# Patient Record
Sex: Female | Born: 1950 | Hispanic: No | Marital: Married
Health system: Southern US, Community
[De-identification: ages and names within clinical notes are randomized; demographics above are authoritative.]

---

## 2004-06-24 ENCOUNTER — Emergency Department: Payer: Self-pay | Admitting: Emergency Medicine

## 2005-03-30 ENCOUNTER — Ambulatory Visit: Payer: Self-pay | Admitting: Family Medicine

## 2005-04-04 ENCOUNTER — Ambulatory Visit: Payer: Self-pay | Admitting: Family Medicine

## 2005-07-12 ENCOUNTER — Other Ambulatory Visit: Payer: Self-pay

## 2005-07-12 ENCOUNTER — Ambulatory Visit: Payer: Self-pay | Admitting: Surgery

## 2005-07-17 ENCOUNTER — Ambulatory Visit: Payer: Self-pay | Admitting: Surgery

## 2006-04-19 ENCOUNTER — Ambulatory Visit: Payer: Self-pay | Admitting: Internal Medicine

## 2006-04-21 ENCOUNTER — Ambulatory Visit: Payer: Self-pay | Admitting: Internal Medicine

## 2006-12-07 ENCOUNTER — Emergency Department: Payer: Self-pay | Admitting: Emergency Medicine

## 2006-12-21 ENCOUNTER — Ambulatory Visit: Payer: Self-pay | Admitting: Internal Medicine

## 2007-01-07 ENCOUNTER — Ambulatory Visit: Payer: Self-pay | Admitting: Internal Medicine

## 2007-01-21 ENCOUNTER — Ambulatory Visit: Payer: Self-pay | Admitting: Internal Medicine

## 2007-02-20 ENCOUNTER — Ambulatory Visit: Payer: Self-pay | Admitting: Internal Medicine

## 2007-02-28 ENCOUNTER — Ambulatory Visit: Payer: Self-pay

## 2007-12-06 ENCOUNTER — Other Ambulatory Visit: Payer: Self-pay

## 2007-12-06 ENCOUNTER — Emergency Department: Payer: Self-pay | Admitting: Internal Medicine

## 2009-08-20 ENCOUNTER — Ambulatory Visit: Payer: Self-pay | Admitting: Internal Medicine

## 2009-09-09 ENCOUNTER — Ambulatory Visit: Payer: Self-pay | Admitting: Internal Medicine

## 2009-09-15 ENCOUNTER — Ambulatory Visit: Payer: Self-pay | Admitting: Family Medicine

## 2009-09-19 ENCOUNTER — Ambulatory Visit: Payer: Self-pay | Admitting: Internal Medicine

## 2010-08-27 ENCOUNTER — Emergency Department: Payer: Self-pay | Admitting: Emergency Medicine

## 2010-12-26 ENCOUNTER — Ambulatory Visit: Payer: Self-pay | Admitting: Internal Medicine

## 2011-03-21 ENCOUNTER — Inpatient Hospital Stay: Payer: Self-pay | Admitting: Internal Medicine

## 2011-04-20 ENCOUNTER — Ambulatory Visit: Payer: Self-pay | Admitting: Vascular Surgery

## 2011-05-24 ENCOUNTER — Ambulatory Visit: Payer: Self-pay | Admitting: Vascular Surgery

## 2011-06-21 ENCOUNTER — Ambulatory Visit: Payer: Self-pay | Admitting: Vascular Surgery

## 2011-09-26 ENCOUNTER — Ambulatory Visit: Payer: Self-pay | Admitting: Vascular Surgery

## 2011-09-26 LAB — POTASSIUM: Potassium: 3.8 mmol/L (ref 3.5–5.1)

## 2011-11-15 ENCOUNTER — Ambulatory Visit: Payer: Self-pay | Admitting: Vascular Surgery

## 2012-01-19 ENCOUNTER — Emergency Department: Payer: Self-pay | Admitting: *Deleted

## 2012-01-28 ENCOUNTER — Inpatient Hospital Stay: Payer: Self-pay | Admitting: Internal Medicine

## 2012-01-28 LAB — COMPREHENSIVE METABOLIC PANEL
Alkaline Phosphatase: 98 U/L (ref 50–136)
Chloride: 103 mmol/L (ref 98–107)
Co2: 22 mmol/L (ref 21–32)
EGFR (African American): 6 — ABNORMAL LOW
EGFR (Non-African Amer.): 5 — ABNORMAL LOW
SGOT(AST): 36 U/L (ref 15–37)
SGPT (ALT): 17 U/L (ref 12–78)

## 2012-01-28 LAB — DRUG SCREEN, URINE
Amphetamines, Ur Screen: NEGATIVE (ref ?–1000)
Cocaine Metabolite,Ur ~~LOC~~: NEGATIVE (ref ?–300)
Opiate, Ur Screen: POSITIVE (ref ?–300)
Phencyclidine (PCP) Ur S: NEGATIVE (ref ?–25)
Tricyclic, Ur Screen: NEGATIVE (ref ?–1000)

## 2012-01-28 LAB — CBC WITH DIFFERENTIAL/PLATELET
Basophil %: 0.4 %
Eosinophil #: 0 10*3/uL (ref 0.0–0.7)
Eosinophil %: 0.1 %
HCT: 19.8 % — ABNORMAL LOW (ref 35.0–47.0)
HGB: 6.4 g/dL — ABNORMAL LOW (ref 12.0–16.0)
Lymphocyte #: 1.5 10*3/uL (ref 1.0–3.6)
Lymphocyte %: 29 %
MCH: 26.4 pg (ref 26.0–34.0)
MCHC: 32.4 g/dL (ref 32.0–36.0)
MCV: 82 fL (ref 80–100)
Neutrophil #: 3.2 10*3/uL (ref 1.4–6.5)
Neutrophil %: 62.5 %
RBC: 2.43 10*6/uL — ABNORMAL LOW (ref 3.80–5.20)
RDW: 14.9 % — ABNORMAL HIGH (ref 11.5–14.5)

## 2012-01-28 LAB — URINALYSIS, COMPLETE
Bilirubin,UR: NEGATIVE
Ketone: NEGATIVE
Ph: 5 (ref 4.5–8.0)
Protein: NEGATIVE
RBC,UR: 15 /HPF (ref 0–5)
Specific Gravity: 1.01 (ref 1.003–1.030)
Squamous Epithelial: <1

## 2012-01-29 LAB — CBC WITH DIFFERENTIAL/PLATELET
Basophil #: 0 10*3/uL (ref 0.0–0.1)
Eosinophil #: 0 10*3/uL (ref 0.0–0.7)
Eosinophil %: 0.1 %
Lymphocyte #: 1.1 10*3/uL (ref 1.0–3.6)
MCH: 26.1 pg (ref 26.0–34.0)
MCV: 82 fL (ref 80–100)
Monocyte #: 0.4 x10 3/mm (ref 0.2–0.9)
Monocyte %: 8.6 %
Neutrophil #: 3 10*3/uL (ref 1.4–6.5)
Platelet: 92 10*3/uL — ABNORMAL LOW (ref 150–440)
RDW: 14.4 % (ref 11.5–14.5)

## 2012-01-29 LAB — BASIC METABOLIC PANEL
Calcium, Total: 5.5 mg/dL — CL (ref 8.5–10.1)
Chloride: 104 mmol/L (ref 98–107)
Co2: 18 mmol/L — ABNORMAL LOW (ref 21–32)
Creatinine: 7.8 mg/dL — ABNORMAL HIGH (ref 0.60–1.30)
Sodium: 134 mmol/L — ABNORMAL LOW (ref 136–145)

## 2012-02-03 LAB — CULTURE, BLOOD (SINGLE)

## 2012-05-22 DEATH — deceased

## 2014-01-29 IMAGING — US US EXTREM LOW VENOUS BILAT
1 series · 14 of 24 positions shown · non-contrast
Comparison: none

REASON FOR EXAM: tender and swollen
COMMENTS:

PROCEDURE:     US  - US DOPPLER LOW EXTR BILATERAL  - January 29, 2012 [DATE]
RESULT:     Comparison: None

[Series 1: us extrem low venous bilat · 0.09mm/px · 14 of 70 slices shown]
[im 1/70]
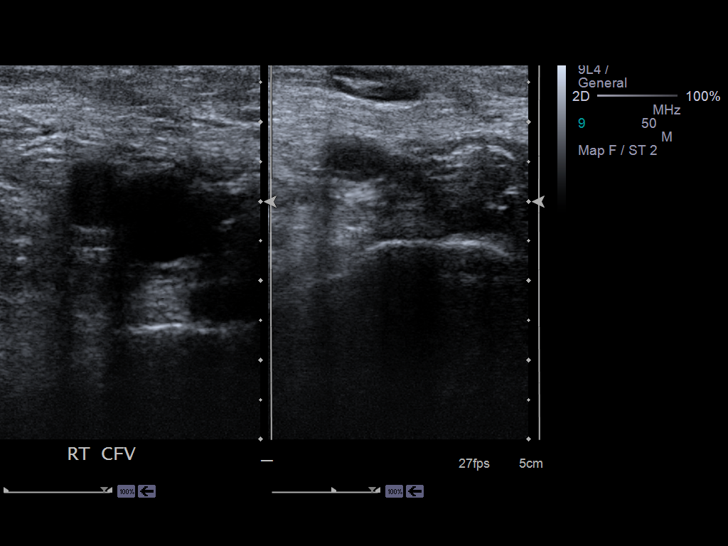
[im 7/70]
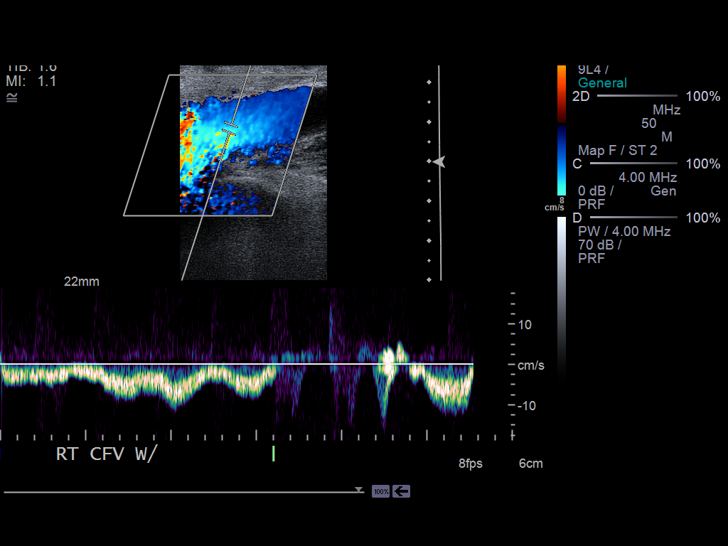
[im 13/70]
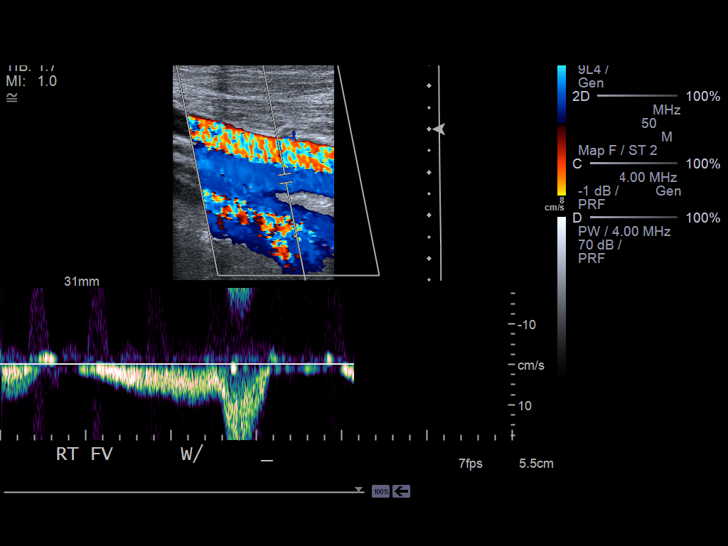
[im 19/70]
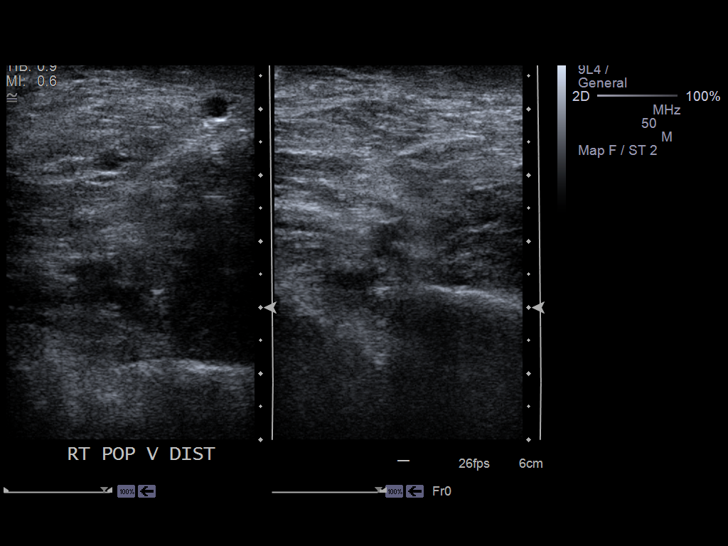
[im 22/70]
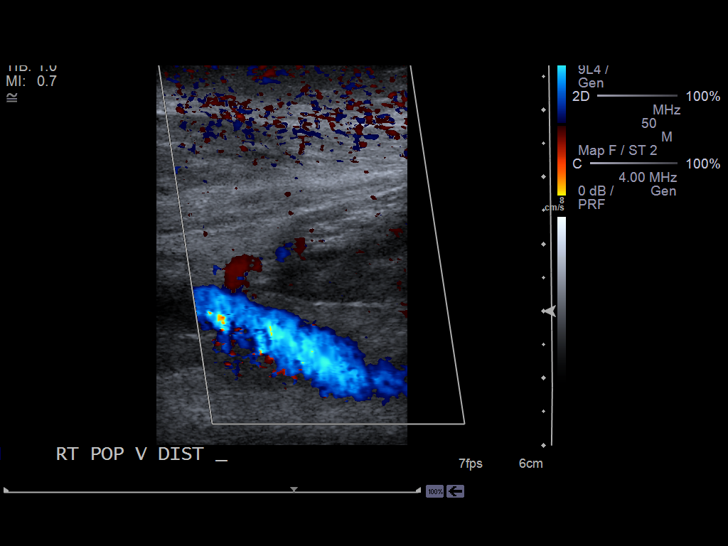
[im 28/70]
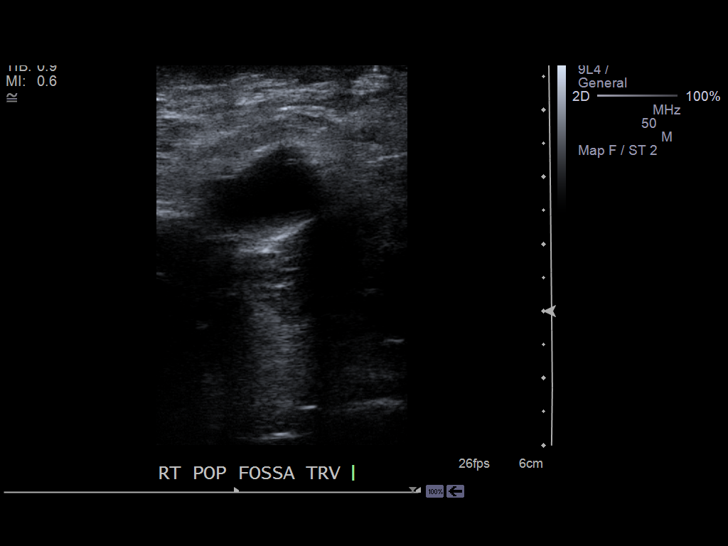
[im 34/70]
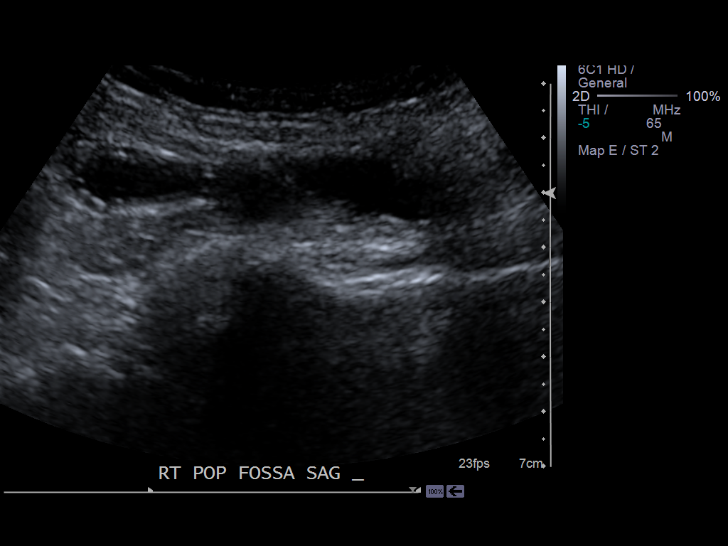
[im 37/70]
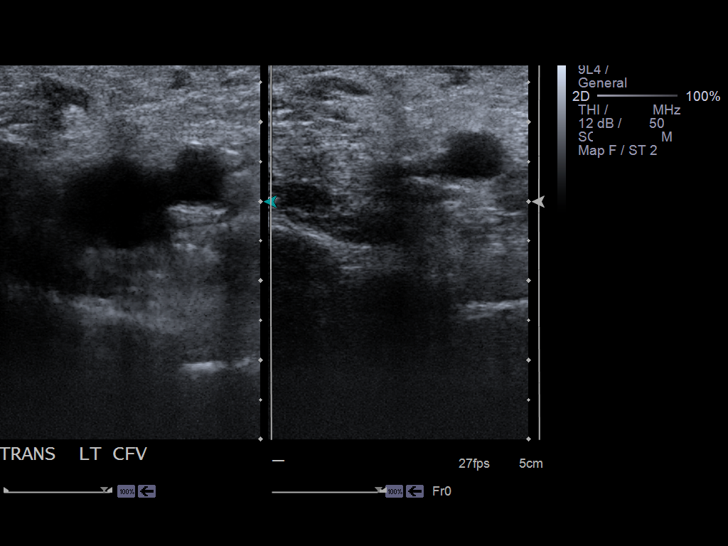
[im 43/70]
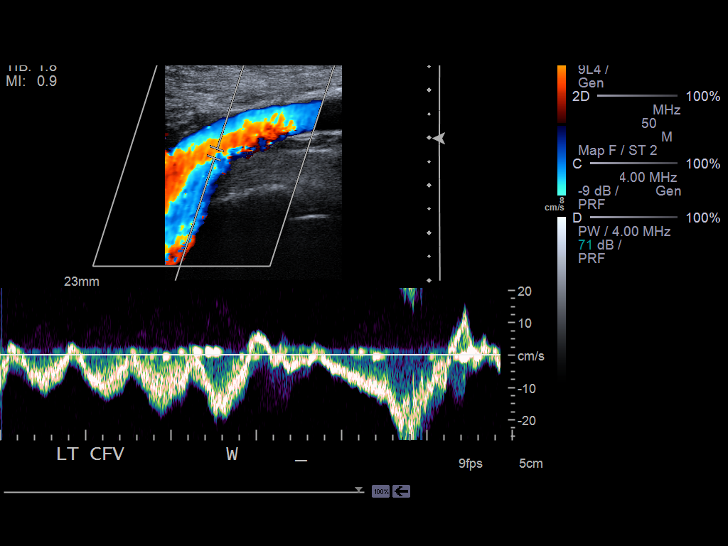
[im 49/70]
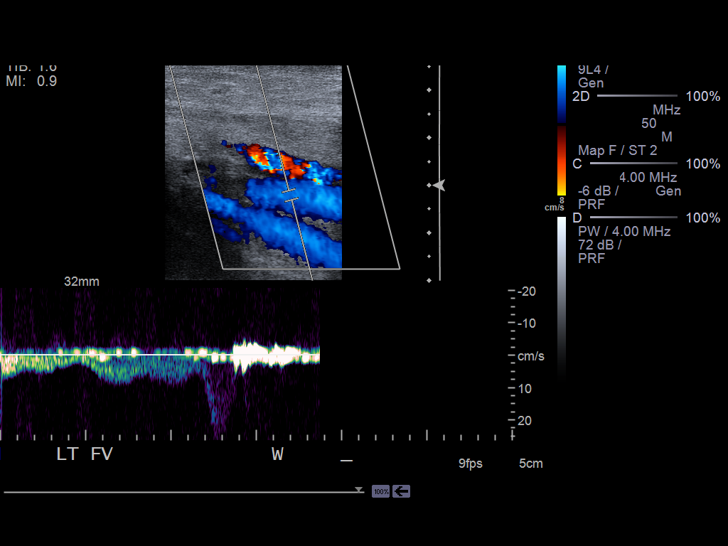
[im 55/70]
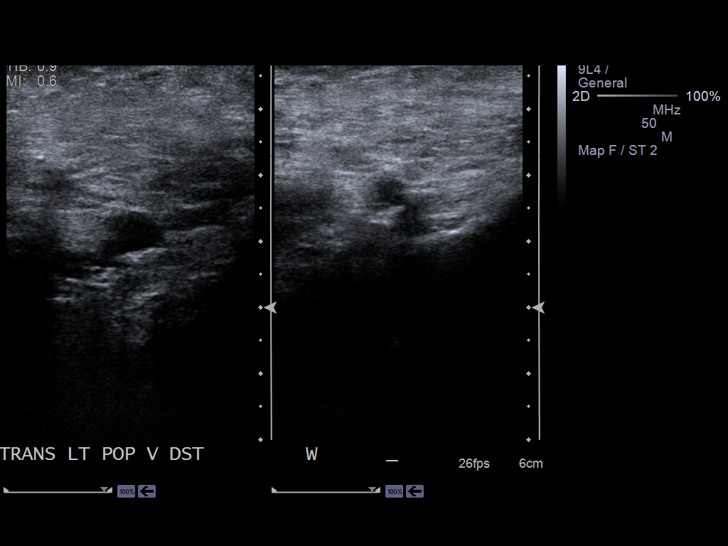
[im 58/70]
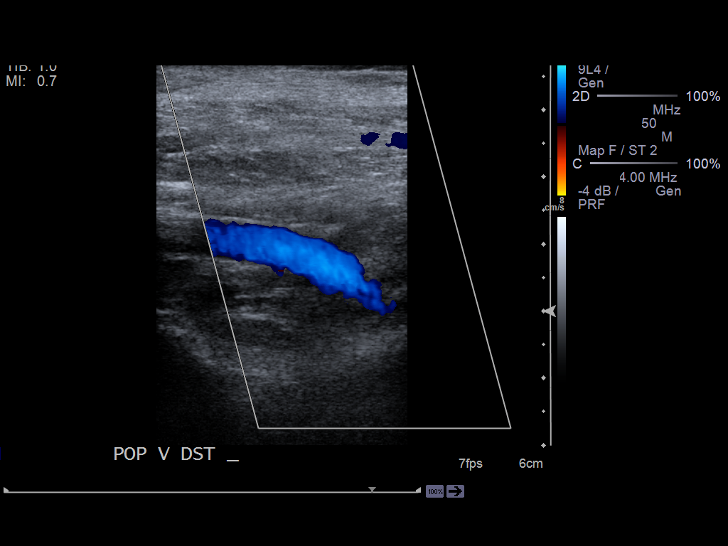
[im 64/70]
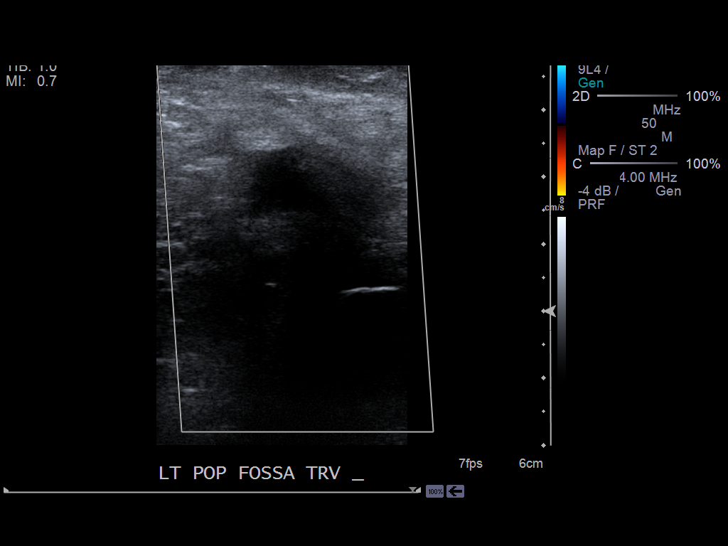
[im 70/70]
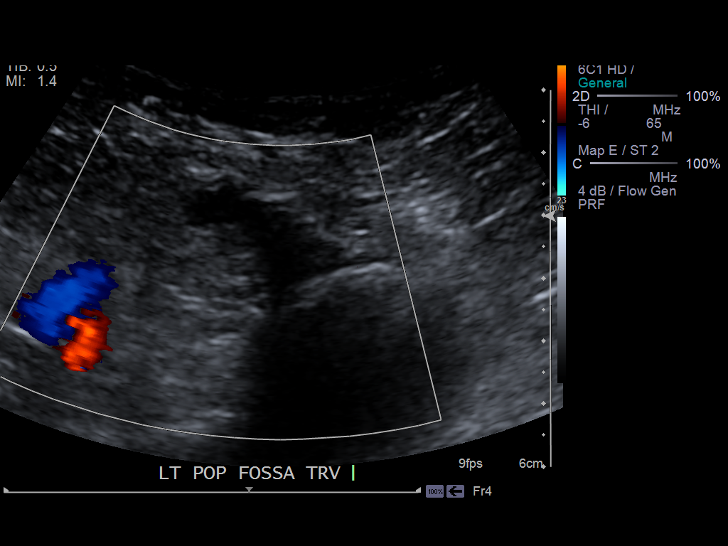

[14 of 24 positions shown; findings below may reference images not displayed]

FINDINGS: Multiple longitudinal and transverse gray-scale as well as color
and spectral Doppler images of the bilateral lower extremity veins were
obtained from the common femoral veins through the popliteal veins.

The bilateral common femoral, femoral, and popliteal veins are patent,
demonstrating normal color-flow and compressibility. No intraluminal
thrombus is identified.There is normal respiratory variation and
augmentation demonstrated at all vein levels.

There are hypoechoic collections in the popliteal fossa bilaterally. These
likely represent Baker's cysts. On the right, it measures 6.3 x 2.0 x
cm. On the left it measures 3.8 x 1.5 x 2.2 cm.
IMPRESSION: No evidence of DVT in the bilateral lower extremities.

[REDACTED]

## 2014-09-08 NOTE — H&P (Signed)
PATIENT NAME:  Valerie Tyler, Valerie Tyler MR#:  [id number] DATE OF BIRTH:  [birth year]  DATE OF ADMISSION:  01/28/2012  ADMITTING PHYSICIAN: Valerie Baas, MD    PRIMARY CARE PHYSICIAN: Valerie Hancock, MD at  [facility]   CHIEF COMPLAINT: Brought in by EMS for generalized weakness and aches all over.   HISTORY OF PRESENT ILLNESS: [name] is a 64 year old African American female with past medical history significant for schizophrenia and bipolar disorder, endstage renal disease on hemodialysis, hypertension, and chronic pain syndrome, was brought from home by EMS as she mentioned aching all over and feeling weak. The patient is a very inconsistent and poor historian. I tried to reach her daughters but was not able to reach them and left a voicemail. According to one of her versions, she had been aching all over so called EMS this morning. Her daughter called around 3:00 a.m. last night. She works in [employer] and is being deployed, and they talked about the conditions in [place]. Then she told her that she ached all over, including her follicles. Her story changed a little bit later.  She does not even remember she was in the hospital. She is on a lot of psychiatric medications. I am not sure if this is her baseline or this is secondary to medications. It seems like the same thing happened when she was here in the hospital in October 2012 with her mental status. Based on her labs, her potassium seems to be within normal limits, but her calcium is very low at 5.1 with a normal albumin; and her creatinine is elevated at greater than 8.0. She also has a low hemoglobin of 6.8. When asked about her if she runs low, she said she was at [facility] last week about her low hemoglobin, and she does not remember what they were telling her. She is being admitted for further evaluation and treatment.   PAST MEDICAL HISTORY: 1. End-stage renal disease on hemodialysis.  2. Anemia of chronic disease. 3. Hypertension.   4. Chronic pain syndrome.  5. Schizophrenia and bipolar disorder.  6. Gastroesophageal reflux disease.   PAST SURGICAL HISTORY: Fistula  placement in the right forearm. Other surgery she is not able to tell at this time.   ALLERGIES: Based on the records, the patient is allergic to IV dye, morphine, and also latex.   CURRENT MEDICATIONS: There is no complete list of her medications at this time. Based on her last discharge and from pharmacist note, the patient is on the following medications.  1. Cyclobenzaprine 10 mg every 8 hours p.r.n.  2. Klonopin 2 mg t.i.d.  3. Seroquel 100 mg at bedtime.  4. Diazepam 10 mg on dialysis days and one at night as needed for anxiety.  5. Effexor 150 mg p.o. daily.  6. Triamcinolone ointment as needed, apply to the area.  7. Hydroxyzine 25 mg every 6 hours p.r.n.  8. Travatan Solution Eye Drops once a day, 1 drop to both eyes.  9. Flonase nasal spray, 2 sprays each nostril every day.  10. Zyrtec 10 mg daily.  11. Norvasc 5 mg p.o. daily.  12. 5% Lidoderm patch as needed for pain.   SOCIAL HISTORY: She lives at home with her daughter. No smoking or alcohol use.   FAMILY HISTORY: From previous records, it seems like her father had oral cancer.   REVIEW OF SYSTEMS: Review of systems is difficult to be obtained secondary to the patient's mental status. CONSTITUTIONAL: She complains of fatigue  and weakness. There is no fever. EYES: She says she has glaucoma. No blurred vision or double vision. ENT: No tinnitus, ear pain, or hearing loss. RESPIRATORY: No cough, wheeze, or chronic obstructive pulmonary disease. CARDIOVASCULAR: No chest pain, orthopnea, edema, arrhythmia, or palpitations. GASTROINTESTINAL: No nausea, vomiting, abdominal pain, hematemesis, or melena. GENITOURINARY: The patient makes only a little urine, and she is on dialysis. ENDOCRINE: No polyuria, nocturia, thyroid problems, heat or cold intolerance. HEMATOLOGY: Positive for anemia. SKIN:  There is like a macular mottled rash on both her thighs and also both her legs. MUSCULOSKELETAL: Her legs beneath the knees appear swollen and also erythematous. NEUROLOGIC: No numbness, weakness, cerebrovascular accident, transient ischemic attack. PSYCHOLOGIC: Positive for history of schizophrenia, and bipolar disorder, and also anxiety.    PHYSICAL EXAMINATION:  VITAL SIGNS: Temperature is 97.4 degrees Fahrenheit, pulse 89, respirations 18, blood pressure 108/64, pulse oximetry 97% on room air.   GENERAL: A well built, well nourished female lying in bed, not in any acute distress.   HEENT: Normocephalic, atraumatic. Pupils are equal, round, reacting to light. Anicteric sclerae. Extraocular movements are intact. Oropharynx is clear without erythema, mass or exudates.   NECK: Supple. She has engorged vein in the right supraclavicular fossa likely from increased pressure from her fistula. No lymphadenopathy or thyromegaly.   LUNGS: Clear to auscultation bilaterally. No wheeze or crackles. No use of accessory muscles for breathing.   CARDIOVASCULAR: S1, S2, regular rate and rhythm. No murmurs, rubs, or gallops.   ABDOMEN: Soft, nontender, nondistended. No hepatosplenomegaly. Normal bowel sounds.   EXTREMITIES: She has 2+ pitting edema beneath the knees, erythema. They are warm to touch and also slightly tender. She has feeble dorsalis pedis pulses palpable bilaterally.   SKIN: She has mottled macular erythematous rash on both lower extremities, including the thighs.   LYMPHATICS: No cervical or inguinal lymphadenopathy.   NEUROLOGIC: Cranial nerves are intact. She is able to move all four extremities. She did not let me do a complete neurological exam otherwise.   PSYCHOLOGICAL: The patient is sleepy, easily arousable, and oriented x2 to place and person.   LABORATORY, DIAGNOSTIC AND RADIOLOGICAL DATA: WBC is 5.9, hemoglobin 6.1, hematocrit 19.8, platelet count 104, MCV is 82.0. Sodium  137, potassium 4.2, chloride 103, bicarbonate 22, BUN 8, creatinine 8.29, glucose 89, calcium 5.1. Albumin 3.7, total bilirubin 0.3, AST 36, ALT 17. Urine drug screen is negative. Urinalysis showing 2+ blood and 15 RBC. trace  bacteria and no protein. EKG: Normal sinus rhythm 83, slightly prolonged QT interval at 438 ms. Corrected QT is 514 ms.   ASSESSMENT AND PLAN: A 64 year old female with end-stage renal disease on hemodialysis, schizophrenia, bipolar. She was brought by EMS from home and is complaining of body aches. Her labs are showing low calcium of 5.1, creatinine elevated, and also low hemoglobin. She is a very poor historian and unable to reach the family.   1. Hypocalcemia with some EKG changes: We will do calcium supplements IV and also Nephrology has been consulted, and she will be monitored on telemetry.  2. Bilateral lower extremity cellulitis: Warm, tender and erythematous, unable to know when it started. Dopplers are ordered to rule out deep venous thrombosis. Blood cultures are ordered and vancomycin and Levaquin are started. There is also a rash on the thighs which could be allergic reaction versus part of cellulitis. The patient is unable to tell if any new medications were started. As part of the conversation, she did mention she had some dental  work done last week, and she is not sure if any new antibiotics were started. I need to verify all of this with the family.  3. End-stage renal disease, on hemodialysis: Nephrology has been consulted.  4. Acute on chronic anemia with weakness and fatigue: We will transfuse 1 unit with dialysis and likely Epogen then after.  5. Schizophrenia: Continue home medications of Seroquel, Klonopin and Effexor.  6. Gastrointestinal and deep venous thrombosis prophylaxis: On Protonix and also subcutaneous heparin.   CODE STATUS: FULL CODE.   TIME SPENT ON ADMISSION: 50 minutes. ____________________________ Valerie Baasadhika Zahriyah Joo,  MD rk:cbb D: 01/28/2012 12:55:32 ET T: 01/28/2012 14:10:01 ET JOB#: [id number]  cc: Valerie Baasadhika Xaivier Malay, MD, <Dictator> Sarah "Sallie" Allena KatzPatel, MD [facility] Valerie BaasADHIKA Shoshanna Mcquitty MD ELECTRONICALLY SIGNED 01/28/2012 18:45

## 2014-09-08 NOTE — Discharge Summary (Signed)
PATIENT NAME:  Valerie Tyler, Valerie Tyler MR#:  [id number] DATE OF BIRTH:  Jun 30, 1950  DATE OF ADMISSION:  01/28/2012 DATE OF DISCHARGE:  01/30/2012  The patient left AGAINST MEDICAL ADVICE on 01/30/2012.   PRESENTING COMPLAINT: Weakness and fatigue.   DISCHARGE DIAGNOSES:  1. Hypocalcemia.  2. Bilateral lower extremity edema.  3. End-stage renal disease on hemodialysis.  4. Medical noncompliance.  5. Acute on chronic anemia status post 1 unit of blood transfusion.  6. History of schizophrenia.   DISCHARGE MEDICATIONS: Not dictated since the patient left AGAINST MEDICAL ADVICE.   ADMISSION HOME MEDICATIONS: 1. Clonazepam 2 mg one tablet three times daily. 2. Cyclobenzaprine 10 mg three times daily. 3. Diazepam 10 mg on dialysis days and one at bedtime as needed.  4. Effexor-XR extended-release two capsules daily.  5. Flonase 50 mcg/inhalation one spray daily.  6. Hydroxyzine hydrochloride 25 mg one tablet four times daily. 7. Lidoderm topically as needed.  8. Norvasc 5 mg once a day.  9. Oxycodone 10 mg one tablet orally every six hours.  10. Seroquel 100 mg orally once a day at bedtime.  11. Travatan Z 0.004% ophthalmic one drop in affected eye once a day in the evening.  12. Triamcinolone topically once a day.  13. Zyrtec 10 mg p.o. daily.   CONSULTANTS: Mosetta PigeonHarmeet Singh, MD  BRIEF SUMMARY OF HOSPITAL COURSE:  1. [name] is a 64 year old African American female with history of end stage renal disease on hemodialysis, schizophrenia, and bipolar disorder brought by EMS from home complaining of aching all over. The patient does not remember when she was dialyzed last. She was admitted with hypocalcemia with EKG changes. Calcium supplement was given IV and oral was started. This is likely related to her CKD stage V and her being noncompliant with her dialysis. She continued to improve with her weakness.  2. Bilateral lower extremity edema. Initially it was thought there is some underlying  cellulitis, however, the patient remained afebrile and white count was normal and antibiotics were discontinued. Doppler of the lower extremity was negative for deep venous thrombosis.  3. End stage renal disease, on hemodialysis. The patient does not remember when she got dialyzed. She is noncompliant with dialysis. The patient has been fired by several dialysis units and it is difficult to get her placed at this time. Care management and Dr. Thedore MinsSingh were working with different dialysis units in the area, however, they could not find any new dialysis placement prior to the patient leaving AGAINST MEDICAL ADVICE.  4. Acute on chronic anemia with weakness. Transfused 1 unit on admission.  5. Schizophrenia/bipolar disorder. She is on Seroquel, Klonopin, and Effexor.   Her hospital stay otherwise remained stable.   TIME SPENT: 40 minutes.  ____________________________ Wylie HailSona A. Allena KatzPatel, MD sap:slb D: 01/31/2012 07:09:54 ET T: 01/31/2012 11:47:44 ET JOB#: [id number]  cc: Lizbett Garciagarcia A. Allena KatzPatel, MD, <Dictator> Willow OraSONA A Felesha Moncrieffe MD ELECTRONICALLY SIGNED 02/05/2012 22:14

## 2014-09-13 NOTE — Op Note (Signed)
PATIENT NAME:  Valerie Tyler, Valerie Tyler MR#:  [id number] DATE OF BIRTH:  Oct 11, 1950  DATE OF PROCEDURE:  09/26/2011  PREOPERATIVE DIAGNOSES:  1. Thrombosis right forearm arteriovenous loop graft.  2. End-stage renal disease requiring hemodialysis.   POSTOPERATIVE DIAGNOSES:  1. Thrombosis right arm forearm loop graft.  2. End-stage renal disease requiring hemodialysis.  3. Stricture stenosis of the venous outflow.  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   PROCEDURES PERFORMED:  1. Contrast injection right forearm loop arteriovenous graft.  2. Mechanical thrombectomy right forearm loop graft.  3. Percutaneous transluminal angioplasty of the venous outflow.  4. Placement of FLAIR stent for failed angioplasty venous outflow, right arm.  5. Percutaneous transluminal angioplasty of the subclavian vein.  6. Placement of FLAIR stent for treatment of failed angioplasty, right subclavian vein.   SURGEON: Renford Dills, MD    SEDATION: Precedex drip with 75 mcg of fentanyl IV. Continuous ECG, pulse oximetry and cardiopulmonary monitoring was performed throughout the entire procedure by the Interventional Radiology nurse. Total sedation time was one hour, 30 minutes.   ACCESS:  1. A 6-French sheath, retrograde direction, right forearm loop graft.  2. A 7-French sheath, antegrade direction, right forearm loop graft.   CONTRAST USED: Isovue 73 mL.   FLUOROSCOPY TIME: 6.4 minutes.   INDICATIONS: Valerie Tyler is a 64 year old woman who presented to dialysis earlier today with thrombosis of her forearm loop graft. The risks and benefits were reviewed. All questions were answered. The patient has agreed to proceed with salvage of her graft.   DESCRIPTION OF PROCEDURE: The patient is taken to Special Procedures and placed in the supine position. After adequate sedation is achieved, her right arm is extended palm upward and prepped and draped in sterile fashion. One percent lidocaine is infiltrated in  the soft tissues, and access to the graft is obtained in an antegrade direction with a micropuncture needle, a microwire followed by a micro sheath, J-wire followed by a 6-French sheath. Hand injection of contrast confirms thrombus within the graft and lack of flow, and therefore 4000 units of heparin is given. A Glidewire and KMP catheter are then advanced through the graft and into the venous system where confirmation of patency of the central venous system is made by hand injection.   A Trerotola device is then opened onto the field and used to perform mechanical thrombectomy of the venous portion of the graft. After several passes, a significant improvement is noted. High-grade string sign is noted at the previously placed stent in the venous outflow tract.   One percent lidocaine is infiltrated in the soft tissues overlying the venous limb of the graft, and in a retrograde direction a micropuncture needle is used to access the graft, a microwire followed by a micro sheath. A 6-French sheath is then placed. A Trerotola device is then advanced out into the brachial artery. It is opened but not engaged. It is then slowly withdrawn back into the graft, and once in the graft it is engaged. Several passes are made. Flow is now re-established. One more pass in through the venous limb is then made with the Trerotola is well, and now there is flow in the graft.   A Magic Torque Wire is then advanced through the antegrade sheath, and the wire is used to cross the string sign at the venous outflow. A 7 x 6 balloon is then used to angioplasty this stricture. Follow-up angiography demonstrates significant extravasation, and therefore an 8 x 50 straight  FLAIR stent is deployed across this and post dilated with the 8 mm balloon. Follow-up angiography demonstrates high-grade subclavian stenosis, and a 10 x 4 balloon is advanced across this lesion. Follow-up angiography after inflation demonstrates absolutely no change  whatsoever, and therefore an 8 x 40 FLAIR stent is utilized to cover this area. This is a FLAIR stent which is indeed flared at its proximal edge. There now appears to be significant problems with the several centimeters of vein between the two stents, and a straight 8 x 70 FLAIR stent is deployed bridging the gap and fully expanding the vein. Her venous outflow has now been extended from the forearm loop graft up to the distal subclavian vein. Follow-up angiography demonstrates rapid flow of contrast through the system, and there is no residual thrombus. There are no other significant stenoses. The pursestring sutures are then placed around each sheath. The sheaths are removed, pressure is held, and there are no immediate complications.  INTERPRETATION: Initial views demonstrate thrombus within the graft. Once the thrombus has been cleared, there are two lesions uncovered, one at the venous outflow and one in the distal subclavian. These are both treated. Angioplasty fails in the outflow lesion secondary to significant extravasation and the subclavian lesion secondary to lack of significant improvement, and therefore both these areas are covered with FLAIR stents. Subsequently, the intervening gap of vein appears to be quite undersized and relatively stenotic-appearing, and therefore this is covered with a FLAIR stent bridging the gap.   SUMMARY: Successful salvage of the right forearm loop graft.   ____________________________ Renford DillsGregory G. Evangelynn Lochridge, MD ggs:cbb D: 09/27/2011 14:46:57 ET T: 09/27/2011 17:16:27 ET JOB#: [id number]  cc: Renford DillsGregory G. Johnesha Acheampong, MD, <Dictator> Renford DillsGREGORY G Danelia Snodgrass MD ELECTRONICALLY SIGNED 09/29/2011 7:53

## 2014-09-13 NOTE — Op Note (Signed)
PATIENT NAME:  Valerie Tyler, Valerie Tyler MR#:  [id number] DATE OF BIRTH:  [birth year]  DATE OF PROCEDURE:  11/15/2011  PREOPERATIVE DIAGNOSES:  1. End-stage renal disease.  2. Clotted right arm AV graft.   POSTOPERATIVE DIAGNOSES: 1. End-stage renal disease.  2. Clotted right arm AV graft.   PROCEDURES: 1. Ultrasound guidance for vascular access x2 to right arm AV graft.  2. Right upper extremity shuntogram and central venogram.  3. Catheter directed thrombolysis with 4 mg of TPA with the AngioJet AVX catheter.  4. Mechanical rheolytic thrombectomy through right arm AV graft with the AngioJet AVX catheter.  5. Percutaneous transluminal angioplasty of axillary/subclavian vein for residual stenosis/thrombosis at distal end of stent with 9 mm diameter angioplasty balloon.  6. Fogarty embolectomy for arterial plug after above-mentioned procedures.   SURGEON: Annice NeedyJason S. Leoni Goodness, MD   ANESTHESIA: Local with moderate conscious sedation.   ESTIMATED BLOOD LOSS: Approximately 25 mL.  CONTRAST USED: 30 mL Visipaque.   INDICATION FOR PROCEDURE: The patient is a 64 year old African American female who has had a clotted graft for about a week. She is brought in for an attempt at salvage. Risks and benefits were discussed. Informed consent was obtained.   DESCRIPTION OF PROCEDURE: The patient was brought to the Vascular Interventional Radiology Suite. The right upper extremity was sterilely prepped and draped and a sterile surgical field was created. The graft was accessed under direct ultrasound guidance in both a retrograde and antegrade fashion crossing. Ultrasound guidance was used due to the pulse nature of the graft. 6 French sheaths were placed and the patient was given 3000 units of intravenous heparin for systemic anticoagulation. Kumpe catheters and Magic torque wires were used to get across and through the graft and put a wire into the axillary vein into the brachial artery. Catheter directed thrombolysis was  performed with 4 mg of TPA with the AngioJet AVX catheter instilled in a power pulse spray fashion from the arterial anastomosis into the vein in the upper arm. This was allowed to dwell for approximately 18 to 20 minutes. Mechanical rheolytic thrombectomy was then performed over similar segments. Following this there was some stenosis at the distal end of the stent that went to the axillary/subclavian vein and this was treated with a 9 mm diameter angioplasty balloon with good angiographic completion result. There was still an arterial plug and a Fogarty embolectomy was passed x2 which cleared the arterial plug and restored patency to the graft. Following this the graft was widely patent with a good pulse and no areas of significant residual stenosis and I elected to terminate the procedure. 4-0 Monocryl purse-string suture was placed around both sheaths. Sheaths were removed. Pressure was held. Sterile dressing was placed. The patient tolerated the procedure well and was taken to the recovery room in stable condition.  ____________________________ Annice NeedyJason S. Rajeev Escue, MD jsd:drc D: 11/15/2011 15:52:54 ET T: 11/16/2011 08:40:26 ET JOB#: [id number]  cc: Annice NeedyJason S. Tahje Borawski, MD, <Dictator> Annice NeedyJASON S Dolton Shaker MD ELECTRONICALLY SIGNED 11/16/2011 9:24

## 2014-09-13 NOTE — Op Note (Signed)
PATIENT NAME:  Valerie Tyler, OW MR#:  [id number] DATE OF BIRTH:  01-Mar-1951  DATE OF PROCEDURE:  05/24/2011  PREOPERATIVE DIAGNOSIS: Thrombosis of right forearm loop graft.   POSTOPERATIVE DIAGNOSIS: Thrombosis of right forearm loop graft.   PROCEDURES PERFORMED:  1. Contrast injection right forearm loop AV graft.  2. Mechanical thrombectomy right forearm loop graft.  3. Percutaneous transluminal angioplasty with stent placement for failed angioplasty venous outflow at the anastomosis.  4. Percutaneous transluminal angioplasty arterial anastomosis.   PROCEDURE PERFORMED BY: Renford Dills, MD   SEDATION: Versed 5 mg plus fentanyl 200 mcg administered IV. Continuous ECG, pulse oximetry, and cardiopulmonary monitoring was performed throughout the entire procedure by the interventional radiology nurse. Total sedation time was 1 hour, 45 minutes.   ACCESS:  1. 7 French sheath right forearm loop graft, antegrade direction.  2. 6 French sheath, right forearm loop graft, retrograde direction.   CONTRAST USED: Isovue 40 mL.   FLUOROSCOPY TIME: 14.8 minutes.   INDICATIONS: Ms. Valerie Tyler is a 64 year old woman who is maintained on hemodialysis and presents with thrombosis of her right arm forearm loop graft. The risks and benefits for thrombectomy and revision and/or intervention were reviewed. All questions were answered. The patient agrees to proceed.   PROCEDURE: The patient is taken to Special Procedures and placed in the supine position. After adequate sedation is achieved, she is positioned supine with her right arm extended palm upward. Right arm is prepped and draped in a sterile fashion. 1% lidocaine is infiltrated into the soft tissues overlying the graft and then in an antegrade direction microneedle is inserted, microwire followed by micro sheath, J-wire followed by a 6 French sheath is inserted. A small injection of contrast is used to confirm thrombosis. 4,000 units of heparin is given  and allowed to circulate. Using a combination of catheters including KMP catheter, straight glide catheter as well as wires including a Glidewire and a Magic torque wire, the stricture stenosis at the venous outflow was crossed. Ultimately, the Glidewire combined with the glide catheter worked and the glide catheter was advanced. Hand injection of contrast was then used to demonstrate the more proximal venous anatomy in the central veins which were widely patent. Magic torque wire was then advanced through the glide catheter and a 6 x 6 balloon was advanced across the stenosis and angioplasty was performed to 12 atmospheres for one minute. Follow-up angiography demonstrated forward flow now with hand injection.   Trerotola device was then opened onto the field and Trerotola device was advanced in an antegrade direction up across the venous outflow with pull back. Hand injection of contrast was then used to demonstrate that the graft was now free of thrombus. There were still significant problems at the venous anastomotic site. Previously placed Viabahn stent is noted and it is at the proximal edge of this Viabahn stent that the restenosis has occurred predominantly. After angioplasty I am able to manipulate the wires and catheters through this area but there still remains a high-grade stenosis.   1% lidocaine is infiltrated into the soft tissues and in a retrograde direction 6 French sheath is inserted using the same technique as described above. Trerotola device is then advanced into the arterial system and slowly pulled back until the anastomosis is identified. Anastomosis is then cleared of thrombus with the Trerotola device clearing the arterial portion of the graft. The Trerotola device does deform quite a bit and on follow-up imaging with compression of the graft there is stenosis  is noted at the arterial anastomosis.   Having established forward flow through the entire graft and the graft now being free  of thrombus, the venous anastomotic area is once again inspected. Magnified views with hand injection of contrast demonstrate that the stricture stenosis is still quite prevalent and unacceptable and, therefore, a 7 x 40 fluency stent is deployed across the stricture. It is postdilated to 7 mm. Follow-up angiography now demonstrates an excellent result. There is no extravasation surrounding the venous anastomotic area. The stents are overlapped by a little more than 1 cm and sealed with postdilatation. Venous outflow now appears to be smooth and laminar.   Magic torque wire is then advanced through the retrograde sheath through the arterial anastomosis and a 5 x 4 balloon is used to dilate the arterial anastomosis. Follow-up angiography with compression demonstrates an excellent result with rapid flow of contrast through the system.   Both sutures and both sheaths were removed with purse-string Monocryl sutures. It should be noted that after the fluency was deployed due to the postdilatation a 7 French sheath was inserted over the wire in the antegrade direction. It is also noted at this site moderate hematoma is identified and light pressure is held for approximately 10 minutes postprocedure. The patient tolerated the procedure well otherwise.   INTERPRETATION: Initial views demonstrate thrombus throughout the graft and based on difficulty with passage of wires as well as magnified views at the venous anastomosis, previously placed Viabahn stent has a proximal edge hyperplastic lesion. Central veins are widely patent. Arterial anastomosis demonstrates a stricture stenosis on reflux images.   Following angioplasty of the venous anastomosis, there is improvement but significant residual stenosis remains. Following Trerotola device, there is clearing of the entire graft and it is free of thrombus. Following deployment of a 7 mm fluency stent with postdilatation to 7 mm, there is excellent patency of the venous  outflow. Following angioplasty to 5 mm of arterial inflow, there is successful treatment of this lesion as well.   SUMMARY: Successful salvage of the right arm brachial to brachial vein AV graft.   ____________________________ Renford DillsGregory G. Amante Fomby, MD ggs:drc D: 05/24/2011 16:43:35 ET T: 05/24/2011 17:12:57 ET JOB#: [id number]  cc: Renford DillsGregory G. Deneisha Dade, MD, <Dictator> Renford DillsGREGORY G Doaa Kendzierski MD ELECTRONICALLY SIGNED 06/10/2011 12:28

## 2014-09-13 NOTE — Op Note (Signed)
PATIENT NAME:  Valerie Tyler, Valerie Tyler MR#:  [id number] DATE OF BIRTH:  November 27, 1950  DATE OF PROCEDURE:  06/21/2011  PREOPERATIVE DIAGNOSES:  1. Complication AV dialysis graft.  2. Stricture, axillary vein.  3. End-stage renal disease requiring hemodialysis.   POSTOPERATIVE DIAGNOSES:  1. Complication AV dialysis graft.  2. Stricture, axillary vein.  3. End-stage renal disease requiring hemodialysis.   PROCEDURE PERFORMED: Contrast injection, right forearm loop graft.   SURGEON: Levora DredgeGregory Xara Paulding, Tyler.D.   SEDATION: Precedex drip. Continuous ECG, pulse oximetry and cardiopulmonary monitoring was performed throughout the entire procedure by the interventional radiology nurse. Total sedation time was approximately 45 minutes.   ACCESS: 6 French sheath, right forearm loop graft.   CONTRAST USED: Isovue 15 mL.   FLUOROSCOPY TIME: 0.1 minutes.   INDICATIONS: [name] recently had a thrombosis of her graft. Follow-up ultrasound after declotting the graft demonstrated a high-grade stenosis within the axillary and she is undergoing contrast injection and possible intervention. The risks and benefits were reviewed, al questions are answered, and the patient agrees to proceed.   DESCRIPTION OF PROCEDURE: The patient is taken to special procedures and placed in the supine position. After adequate sedation is achieved, the right arm is prepped and draped in sterile fashion. 1% lidocaine is infiltrated in the soft tissues overlying the graft and access to the graft is obtained with a micropuncture needle, microwire followed microsheath, J-wire followed by a 6 French sheath. Hand injection of contrast is then used to create images of the graft as well as the central venous anatomy. After review of the images, a pursestring suture of 4-0 Monocryl is placed, sheath is pulled, light pressure is held, and there are no immediate complications.   INTERPRETATION: AV graft is widely patent. No evidence of recurrent  stenosis. The previously placed Viabahn stent and the more recently placed FLAIR stent are both widely patent. There is mild narrowing on the order of 40% more proximal to the FLAIR stent, but this does not appear to be limiting flow. In the axillary region, there is a large prominent valve which may be the area of question or concern from the ultrasound; however, there does not appear to be significant obstruction to flow, contrast is moving rapidly through this area, and the remaining portions of the central venous are widely patent. No intervention is undertaken.   SUMMARY: Patent right forearm loop graft.  ____________________________ Renford DillsGregory G. Lavone Weisel, MD ggs:slb D: 06/21/2011 10:52:41 ET T: 06/21/2011 10:58:52 ET JOB#: [id number]  cc: Renford DillsGregory G. Sherah Lund, MD, <Dictator> Renford DillsGREGORY G Asheley Hellberg MD ELECTRONICALLY SIGNED 07/05/2011 11:24
